# Patient Record
Sex: Male | Born: 1965 | Race: White | Hispanic: No | Marital: Married | Smoking: Light tobacco smoker
Health system: Southern US, Community
[De-identification: ages and names within clinical notes are randomized; demographics above are authoritative.]

## PROBLEM LIST (undated history)

## (undated) DIAGNOSIS — Z973 Presence of spectacles and contact lenses: Secondary | ICD-10-CM

## (undated) DIAGNOSIS — M47814 Spondylosis without myelopathy or radiculopathy, thoracic region: Secondary | ICD-10-CM

## (undated) DIAGNOSIS — M1712 Unilateral primary osteoarthritis, left knee: Secondary | ICD-10-CM

## (undated) DIAGNOSIS — I10 Essential (primary) hypertension: Secondary | ICD-10-CM

## (undated) DIAGNOSIS — M545 Low back pain, unspecified: Secondary | ICD-10-CM

## (undated) DIAGNOSIS — Z87442 Personal history of urinary calculi: Secondary | ICD-10-CM

## (undated) DIAGNOSIS — M47817 Spondylosis without myelopathy or radiculopathy, lumbosacral region: Secondary | ICD-10-CM

## (undated) DIAGNOSIS — M48062 Spinal stenosis, lumbar region with neurogenic claudication: Secondary | ICD-10-CM

## (undated) DIAGNOSIS — G4730 Sleep apnea, unspecified: Secondary | ICD-10-CM

## (undated) DIAGNOSIS — R7303 Prediabetes: Secondary | ICD-10-CM

## (undated) DIAGNOSIS — E785 Hyperlipidemia, unspecified: Secondary | ICD-10-CM

## (undated) DIAGNOSIS — N200 Calculus of kidney: Secondary | ICD-10-CM

## (undated) DIAGNOSIS — E119 Type 2 diabetes mellitus without complications: Secondary | ICD-10-CM

## (undated) HISTORY — DX: Unilateral primary osteoarthritis, left knee: M17.12

## (undated) HISTORY — DX: Essential (primary) hypertension: I10

## (undated) HISTORY — DX: Hyperlipidemia, unspecified: E78.5

## (undated) HISTORY — DX: Spondylosis without myelopathy or radiculopathy, lumbosacral region: M47.817

## (undated) HISTORY — PX: APPENDECTOMY: SHX54

## (undated) HISTORY — DX: Type 2 diabetes mellitus without complications: E11.9

## (undated) HISTORY — PX: LITHOTRIPSY: SUR834

## (undated) HISTORY — PX: SPINAL FIXATION SURGERY: SHX1055

## (undated) HISTORY — DX: Spondylosis without myelopathy or radiculopathy, thoracic region: M47.814

## (undated) HISTORY — PX: WISDOM TOOTH EXTRACTION: SHX21

## (undated) HISTORY — DX: Low back pain, unspecified: M54.50

---
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
## 2015-09-21 ENCOUNTER — Encounter: Payer: Self-pay | Admitting: Sports Medicine

## 2015-09-21 ENCOUNTER — Ambulatory Visit (INDEPENDENT_AMBULATORY_CARE_PROVIDER_SITE_OTHER): Payer: BLUE CROSS/BLUE SHIELD

## 2015-09-21 ENCOUNTER — Ambulatory Visit (INDEPENDENT_AMBULATORY_CARE_PROVIDER_SITE_OTHER): Payer: BLUE CROSS/BLUE SHIELD | Admitting: Sports Medicine

## 2015-09-21 DIAGNOSIS — M2142 Flat foot [pes planus] (acquired), left foot: Secondary | ICD-10-CM | POA: Diagnosis not present

## 2015-09-21 DIAGNOSIS — M722 Plantar fascial fibromatosis: Secondary | ICD-10-CM | POA: Diagnosis not present

## 2015-09-21 DIAGNOSIS — M79673 Pain in unspecified foot: Secondary | ICD-10-CM

## 2015-09-21 DIAGNOSIS — M2141 Flat foot [pes planus] (acquired), right foot: Secondary | ICD-10-CM

## 2015-09-21 DIAGNOSIS — M19079 Primary osteoarthritis, unspecified ankle and foot: Secondary | ICD-10-CM

## 2015-09-21 MED ORDER — METHYLPREDNISOLONE 4 MG PO TBPK: ORAL_TABLET | ORAL | Status: DC

## 2015-09-21 NOTE — Patient Instructions (Signed)

## 2015-09-21 NOTE — Progress Notes (Signed)
Patient ID: Mario Rogers, male   DOB: 03-14-1966, 50 y.o.   MRN: [id number] Subjective: Mario Rogers is a 50 y.o. male patient presents to office with complaint of heel pain on the left. Patient admits to post static dyskinesia for 3 months in duration. Patient has treated this problem with OTC inserts or which he uses more so at work in his boot to help his back but hasn't gotten any relief in feet. States that the pain comes and goes and also involves his left ankle. Patient denies any other pedal complaints.  Admits to hx of radiculopathy.  Work: Maintenance on cement floors   There are no active problems to display for this patient.   No current outpatient prescriptions on file prior to visit.   No current facility-administered medications on file prior to visit.    No Known Allergies  Objective: Physical Exam General: The patient is alert and oriented x3 in no acute distress.  Dermatology: Skin is warm, dry and supple bilateral lower extremities. Nails 1-10 are normal. There is no erythema, edema, no eccymosis, no open lesions present. Integument is otherwise unremarkable.  Vascular: Dorsalis Pedis pulse and Posterior Tibial pulse are 2/4 bilateral. Capillary fill time is immediate to all digits.  Neurological: Grossly intact to light touch with an achilles reflex of +2/5 and a  negative Tinel's sign bilateral.  Musculoskeletal: Tenderness to palpation at the medial calcaneal tubercale and through the insertion of the plantar fascia on the left foot. No reproducible pain at left ankle; subjective pain at this area too per patient, No pain with compression of calcaneus bilateral. No pain with tuning fork to calcaneus bilateral. No pain with calf compression bilateral. There is decreased Ankle joint range of motion bilateral. All other joints range of motion within normal limits bilateral. Pes planus foot type bilateral. Strength 5/5 in all groups bilateral.   Xray, Right & Left  foot:  Normal osseous mineralization. Joint spaces preserved. No fracture/dislocation/boney destruction. Diffuse ankle, midfoot, 1st MTPJ, and early IPJ arthritis, + midtarsal breach suggestive of planus, Calcaneal spur present with moderate thickening of plantar fascia. No other soft tissue abnormalities or radiopaque foreign bodies.   Assessment and Plan: Problem List Items Addressed This Visit    None    Visit Diagnoses    Foot pain, unspecified laterality    -  Primary    Relevant Medications    methylPREDNISolone (MEDROL DOSEPAK) 4 MG TBPK tablet    Other Relevant Orders    DG Foot 2 Views Left    DG Foot 2 Views Right    Plantar fasciitis of left foot        Relevant Medications    methylPREDNISolone (MEDROL DOSEPAK) 4 MG TBPK tablet    Pes planus of both feet        Relevant Medications    methylPREDNISolone (MEDROL DOSEPAK) 4 MG TBPK tablet    Osteoarthritis of ankle and foot, unspecified laterality        Relevant Medications    methylPREDNISolone (MEDROL DOSEPAK) 4 MG TBPK tablet      -Complete examination performed. Discussed with patient in detail the condition of plantar fasciitis, how this occurs and general treatment options. Explained both conservative and surgical treatments.  -Xrays reviewed  -Patient declined injection  -Rx Medrol dose pack once completed to start Mobic of which he already has -Recommended good supportive shoes and advised use of OTC insert. Explained to patient that if these orthoses work well, we will  continue with these. If these do not improve his condition and  pain, we will consider custom molded orthoses. -Explained in detail the use of the fascial brace which was dispensed at today's visit for the left foot. -Explained and dispensed to patient daily stretching exercises. -Recommend patient to ice affected area 1-2x daily. -Patient to return to office in 3 weeks for follow up or sooner if problems or questions arise.  Mario Rogers,  DPM

## 2015-10-12 ENCOUNTER — Ambulatory Visit: Payer: BLUE CROSS/BLUE SHIELD | Admitting: Sports Medicine

## 2015-10-18 ENCOUNTER — Ambulatory Visit: Payer: BLUE CROSS/BLUE SHIELD | Admitting: Sports Medicine

## 2019-03-02 ENCOUNTER — Encounter: Payer: Self-pay | Admitting: Neurology

## 2019-03-10 ENCOUNTER — Encounter: Payer: Self-pay | Admitting: Neurology

## 2019-03-15 ENCOUNTER — Other Ambulatory Visit: Payer: Self-pay

## 2019-03-15 ENCOUNTER — Ambulatory Visit (INDEPENDENT_AMBULATORY_CARE_PROVIDER_SITE_OTHER): Payer: 59 | Admitting: Neurology

## 2019-03-15 ENCOUNTER — Encounter: Payer: Self-pay | Admitting: Neurology

## 2019-03-15 VITALS — BP 140/90 | HR 84 | Ht 72.0 in | Wt 287.0 lb

## 2019-03-15 DIAGNOSIS — M21372 Foot drop, left foot: Secondary | ICD-10-CM | POA: Diagnosis not present

## 2019-03-15 DIAGNOSIS — R202 Paresthesia of skin: Secondary | ICD-10-CM | POA: Diagnosis not present

## 2019-03-15 NOTE — Patient Instructions (Signed)
We will request your MRI lumbar spine from [facility] and let you know the next step

## 2019-03-15 NOTE — Progress Notes (Signed)
[facility] Clinic Note - Initial Visit   Date: 03/15/19  Mario Rogers MRN: [id number] DOB: Feb 19, 1966   Dear Dr.  Tomasa Blase:  Thank you for your kind referral of Mario Rogers for consultation of left foot numbness and weakness. Although his history is well known to you, please allow [place] to reiterate it for the purpose of our medical record. The patient was accompanied to the clinic by self.    History of Present Illness: Mario Rogers is a 52 y.o. left-handed male with hypertension, hyperlipidemia, diabetes mellitus, and L4-5 lumbar surgery (1987)  presenting for evaluation of left foot weakness and numbness.   Starting in early September 2020, he developed tingling over the left foot and then developed numbness.  He started having weakness in the left foot, such that he is unable to raise his toes up.  He denies any preceding injury to his back or leg.  He has mild low back pain, no radicular pain.  No weakness in the right foot.  He has noticed intermittent tingling in the right foot. No falls.   He saw Dr. Durwin Nora at [facility] who ordered MRI lumbar spine.  This was performed on 9/14, but is not aware of the results and I do not have them to review.    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 Aliene Beams in 1987.  Out-side paper records, electronic medical record, and images have been reviewed where available and summarized as:  Labs 02/23/2019:  Vitamin B12 563, TSH 1.35, folate 5.9, HbA1c 6.7   Past Medical History:  Diagnosis Date   Acute low back pain    Diabetes mellitus without complication (HCC)    Hyperlipidemia    Hypertension    Kidney stone on left side    Lumbar and sacral osteoarthritis    Osteoarthritis thoracic spine    Primary osteoarthritis of left knee     Past Surgical History:  Procedure Laterality Date   APPENDECTOMY     LITHOTRIPSY     SPINAL FIXATION SURGERY        Medications:  Outpatient Encounter Medications as of 03/15/2019  Medication Sig Note   meloxicam (MOBIC) 15 MG tablet Take 15 mg by mouth daily.    methylPREDNISolone (MEDROL DOSEPAK) 4 MG TBPK tablet Take as instructed    metoprolol (LOPRESSOR) 50 MG tablet Take 50 mg by mouth 1 day or 1 dose. Takes at night 09/21/2015: Received from: External Pharmacy   telmisartan (MICARDIS) 40 MG tablet Take 40 mg by mouth daily.    [DISCONTINUED] telmisartan-hydrochlorothiazide (MICARDIS HCT) 40-12.5 MG tablet Take 1 tablet by mouth 2 (two) times daily. 09/21/2015: Received from: External Pharmacy   hydrochlorothiazide (HYDRODIURIL) 25 MG tablet Take 25 mg by mouth daily.    No facility-administered encounter medications on file as of 03/15/2019.     Allergies: No Known Allergies  Family History: Family History  Problem Relation Age of Onset   Leukemia Mother    Diabetes Father    Early death Brother        MVA    Social History: Social History   Tobacco Use   Smoking status: Never Smoker   Smokeless tobacco: Never Used  Substance Use Topics   Alcohol use: Yes    Alcohol/week: 0.0 standard drinks    Comment: Rarely   Drug use: Never   Social History   Social History Narrative   No children, step children one  One story home   Left handed   Maintenance mechanic    Review of Systems:  CONSTITUTIONAL: No fevers, chills, night sweats, or weight loss.   EYES: No visual changes or eye pain ENT: No hearing changes.  No history of nose bleeds.   RESPIRATORY: No cough, wheezing and shortness of breath.   CARDIOVASCULAR: Negative for chest pain, and palpitations.   GI: Negative for abdominal discomfort, blood in stools or black stools.  No recent change in bowel habits.   GU:  No history of incontinence.   MUSCLOSKELETAL: No history of joint pain or swelling.  No myalgias.   SKIN: + toe nail lesions, rash, and itching.   HEMATOLOGY/ONCOLOGY: Negative for prolonged  bleeding, bruising easily, and swollen nodes.  No history of cancer.   ENDOCRINE: Negative for cold or heat intolerance, polydipsia or goiter.   PSYCH:  No depression or anxiety symptoms.   NEURO: As Above.   Vital Signs:  BP 140/90    Pulse 84    Ht 6' (1.829 m)    Wt 287 lb (130.2 kg)    SpO2 98%    BMI 38.92 kg/m    General Medical Exam:   General:  Well appearing, comfortable.   Eyes/ENT: see cranial nerve examination.   Neck:   No carotid bruits. Respiratory:  Clear to auscultation, good air entry bilaterally.   Cardiac:  Regular rate and rhythm, no murmur.   Extremities:  No deformities.  Mild 1+ pitting edema. Reduced hair grown distally in the lower leg Skin:  Discoloration over the great toenail bilaterally.  Neurological Exam: MENTAL STATUS including orientation to time, place, person, recent and remote memory, attention span and concentration, language, and fund of knowledge is normal.  Speech is not dysarthric.  CRANIAL NERVES: II:  No visual field defects.   III-IV-VI: Pupils equal round and reactive to light.  Normal conjugate, extra-ocular eye movements in all directions of gaze.  No nystagmus.  No ptosis.   V:  Normal facial sensation.    VII:  Normal facial symmetry and movements.   VIII:  Normal hearing and vestibular function.   IX-X:  Normal palatal movement.   XI:  Normal shoulder shrug and head rotation.   XII:  Normal tongue strength and range of motion, no deviation or fasciculation.  MOTOR:  No atrophy, fasciculations or abnormal movements.  No pronator drift.   Upper Extremity:  Right  Left  Deltoid  5/5   5/5   Biceps  5/5   5/5   Triceps  5/5   5/5   Infraspinatus 5/5  5/5  Medial pectoralis 5/5  5/5  Wrist extensors  5/5   5/5   Wrist flexors  5/5   5/5   Finger extensors  5/5   5/5   Finger flexors  5/5   5/5   Dorsal interossei  5/5   5/5   Abductor pollicis  5/5   5/5   Tone (Ashworth scale)  0  0   Lower Extremity:  Right  Left  Hip  flexors  5/5   5/5   Hip extensors  5/5   5/5   Adductor 5/5  5/5  Abductor 5/5  5/5  Knee flexors  5/5   5/5   Knee extensors  5/5   5/5   Dorsiflexors  5/5   2/5   Plantarflexors  5/5   5/5   Eversion 5/5  4-/5  Inversion 5/5  4-/5  Toe extensors  5/5   2/5   Toe flexors  5/5   5/5   Tone (Ashworth scale)  0  0   MSRs:  Right        Left                  brachioradialis 2+  2+  biceps 2+  2+  triceps 2+  2+  patellar 2+  2+  ankle jerk 0  0  Hoffman no  no  plantar response down  down   SENSORY:  Vibration reduced at the great toe bilaterally and R ankle 50%, L ankle 20%, intact at the knees.  Pin prick and temperature is reduced over the dorsum of the left foot.    COORDINATION/GAIT: Normal finger-to- nose-finger.  Intact rapid alternating movements bilaterally.  Able to rise from a chair without using arms.  Gait shows mild dragging of the left foot.  He is able to walk on toes, unable to stand on heels on the left.   IMPRESSION: 1. Left foot drop and paresthesias most concerning for L5 radiculopathy given the involvement of inversion and preservation of plantar flexion.  Peroneal mononeuropathy seems less likely as this would only involve dorsiflexion and eversion.  I will MRI lumbar spine report from [facility] to review.  If there is no lesion in the lumbar spine to explain symptoms, NCS/EMG of the leg is the next step.    2. Peripheral neuropathy affecting bilateral feet, contributed by diabetes.  Consider further evaluation for this at a later date as the priority is addressing his left foot drop.  Patient educated on daily foot inspection, fall prevention, and safety precautions around the home.  Further recommendations pending MRI result.  Thank you for allowing me to participate in patient's care.  If I can answer any additional questions, I would be pleased to do so.    Sincerely,    Jillianne Gamino K. Allena KatzPatel, DO

## 2019-03-18 ENCOUNTER — Telehealth: Payer: Self-pay

## 2019-03-18 ENCOUNTER — Telehealth: Payer: Self-pay | Admitting: Neurology

## 2019-03-18 NOTE — Telephone Encounter (Signed)
Dr.Patel have you seen these results, I do not see them. Please advise

## 2019-03-18 NOTE — Telephone Encounter (Signed)
Close encounter 

## 2019-03-18 NOTE — Telephone Encounter (Signed)
Pt notified he will contact [facility] and have them fax results

## 2019-03-18 NOTE — Telephone Encounter (Signed)
Patient is calling in about the MRI at [facility]. He is wanting to ask some questions about that and see if Dr. Posey Pronto has seen those results. Thanks!

## 2019-03-18 NOTE — Telephone Encounter (Signed)
No, I have not rec'd them.  Please call them again and see if they can fax the MRI lumbar spine. Thanks.

## 2019-03-19 ENCOUNTER — Telehealth: Payer: Self-pay | Admitting: Neurology

## 2019-03-19 NOTE — Telephone Encounter (Signed)
ROI fax to [facility] for records.

## 2019-04-21 ENCOUNTER — Other Ambulatory Visit: Payer: Self-pay | Admitting: Neurosurgery

## 2019-04-21 ENCOUNTER — Telehealth: Payer: Self-pay | Admitting: Nurse Practitioner

## 2019-04-21 DIAGNOSIS — M48062 Spinal stenosis, lumbar region with neurogenic claudication: Secondary | ICD-10-CM

## 2019-04-21 NOTE — Telephone Encounter (Signed)
Phone call to patient to verify medication list and allergies for myelogram procedure. Pt aware he will not need to hold any medications for this procedure. Pre and post procedure instructions reviewed with pt. Pt verbalized understanding.  

## 2019-05-04 ENCOUNTER — Other Ambulatory Visit: Payer: 59

## 2019-05-07 ENCOUNTER — Other Ambulatory Visit: Payer: Self-pay

## 2019-05-07 ENCOUNTER — Ambulatory Visit
Admission: RE | Admit: 2019-05-07 | Discharge: 2019-05-07 | Disposition: A | Discharge status: Home / Self Care | Payer: 59 | Source: Ambulatory Visit | Attending: Neurosurgery | Admitting: Neurosurgery

## 2019-05-07 DIAGNOSIS — M48062 Spinal stenosis, lumbar region with neurogenic claudication: Secondary | ICD-10-CM

## 2019-05-07 MED ORDER — IOPAMIDOL (ISOVUE-M 200) INJECTION 41%
20.0000 mL | Freq: Once | INTRAMUSCULAR | Status: AC
  Administered 2019-05-07: 20 mL via INTRATHECAL

## 2019-05-07 MED ORDER — DIAZEPAM 5 MG PO TABS
10.0000 mg | ORAL_TABLET | Freq: Once | ORAL | Status: AC
  Administered 2019-05-07: 09:00:00 10 mg via ORAL

## 2019-05-07 NOTE — Discharge Instructions (Signed)

## 2019-05-17 ENCOUNTER — Other Ambulatory Visit: Payer: Self-pay | Admitting: Neurosurgery

## 2019-05-24 ENCOUNTER — Encounter (HOSPITAL_COMMUNITY): Payer: Self-pay

## 2019-05-24 ENCOUNTER — Other Ambulatory Visit (HOSPITAL_COMMUNITY)
Admission: RE | Admit: 2019-05-24 | Discharge: 2019-05-24 | Disposition: A | Discharge status: Home / Self Care | Payer: 59 | Source: Ambulatory Visit | Attending: Neurosurgery | Admitting: Neurosurgery

## 2019-05-24 ENCOUNTER — Encounter (HOSPITAL_COMMUNITY)
Admission: RE | Admit: 2019-05-24 | Discharge: 2019-05-24 | Disposition: A | Discharge status: Home / Self Care | Payer: 59 | Source: Ambulatory Visit | Attending: Neurosurgery | Admitting: Neurosurgery

## 2019-05-24 ENCOUNTER — Other Ambulatory Visit: Payer: Self-pay

## 2019-05-24 DIAGNOSIS — Z01812 Encounter for preprocedural laboratory examination: Secondary | ICD-10-CM | POA: Insufficient documentation

## 2019-05-24 DIAGNOSIS — Z01810 Encounter for preprocedural cardiovascular examination: Secondary | ICD-10-CM | POA: Insufficient documentation

## 2019-05-24 DIAGNOSIS — I10 Essential (primary) hypertension: Secondary | ICD-10-CM | POA: Insufficient documentation

## 2019-05-24 HISTORY — DX: Prediabetes: R73.03

## 2019-05-24 HISTORY — DX: Sleep apnea, unspecified: G47.30

## 2019-05-24 HISTORY — DX: Presence of spectacles and contact lenses: Z97.3

## 2019-05-24 HISTORY — DX: Personal history of urinary calculi: Z87.442

## 2019-05-24 HISTORY — DX: Spinal stenosis, lumbar region with neurogenic claudication: M48.062

## 2019-05-24 LAB — BASIC METABOLIC PANEL
Anion gap: 9 (ref 5–15)
BUN: 15 mg/dL (ref 6–20)
CO2: 29 mmol/L (ref 22–32)
Calcium: 9.6 mg/dL (ref 8.9–10.3)
Chloride: 103 mmol/L (ref 98–111)
Creatinine, Ser: 1.1 mg/dL (ref 0.61–1.24)
GFR calc Af Amer: >60 mL/min (ref >60)
GFR calc non Af Amer: >60 mL/min (ref >60)
Glucose, Bld: 101 mg/dL — ABNORMAL HIGH (ref 70–99)
Potassium: 4.2 mmol/L (ref 3.5–5.1)
Sodium: 141 mmol/L (ref 135–145)

## 2019-05-24 LAB — CBC
HCT: 48.8 % (ref 39.0–52.0)
Hemoglobin: 15.8 g/dL (ref 13.0–17.0)
MCH: 28.6 pg (ref 26.0–34.0)
MCHC: 32.4 g/dL (ref 30.0–36.0)
MCV: 88.2 fL (ref 80.0–100.0)
Platelets: 188 10*3/uL (ref 150–400)
RBC: 5.53 MIL/uL (ref 4.22–5.81)
RDW: 12.6 % (ref 11.5–15.5)
WBC: 9.3 10*3/uL (ref 4.0–10.5)
nRBC: 0 % (ref 0.0–0.2)

## 2019-05-24 LAB — SARS CORONAVIRUS 2 (TAT 6-24 HRS): SARS Coronavirus 2: NEGATIVE

## 2019-05-24 LAB — SURGICAL PCR SCREEN
MRSA, PCR: NEGATIVE
Staphylococcus aureus: NEGATIVE

## 2019-05-24 LAB — GLUCOSE, CAPILLARY: Glucose-Capillary: 86 mg/dL (ref 70–99)

## 2019-05-24 NOTE — Progress Notes (Signed)
Pt stated that his blood pressure is elevated today during PAT visit because he is having pain and he only took " half of Furosemide -20 mg instead of 40 " because he had to travel and didn't want to have to keep making stops to void. Pt denies SOB, chest pain, and being under the care of a cardiologist. Pt stated that his PCP is Dr. Nelda Bucks, in [place], [place]. Pt stated that a stress test was performed > 20 years ago but denies having an echo and cardiac cath. Pt denies having an EKG and chest x ray in the last year. Pt denies recent labs. Pt denies having Diabetes stating " I was told that I am borderline diabetic, I don't take medicine and I no longer check my sugar. " Pt verbalized understanding of all pre-op instructions.

## 2019-05-24 NOTE — Pre-Procedure Instructions (Addendum)
   Mario Rogers  05/24/2019    [id number] - [place] [address] Phone: [phone] Fax: [phone]   Your procedure is scheduled on Wednesday, May 26, 2019  Report to [facility] Admitting at 6:30 A.M.  Call this number if you have problems the morning of surgery:  [phone]   Remember:  Do not eat or drink after midnight the night before surgery.    Take these medicines the morning of surgery with A SIP OF WATER :  If needed: acetaminophen (TYLENOL) for pain  Stop taking Aspirin (unless otherwise advised by surgeon), vitamins, fish oil and herbal medications. Do not take any NSAIDs ie: Meloxicam (Mobic),  Ibuprofen, Advil, Naproxen (Aleve), Motrin, BC and Goody Powder; stop now.  Do not wear jewelry, make-up or nail polish.  Do not wear lotions, powders, or perfumes, or deodorant.  Do not shave 48 hours prior to surgery.  Men may shave face and neck.  Do not bring valuables to the hospital.  [facility] is not responsible for any belongings or valuables.  Contacts, dentures or bridgework may not be worn into surgery.  Leave your suitcase in the car.  After surgery it may be brought to your room.  For patients admitted to the hospital, discharge time will be determined by your treatment team. Patients discharged the day of surgery will not be allowed to drive home.   Special instructions: See " [facility] Preparing For Surgery " sheet.  Please read over the following fact sheets that you were given. Pain Booklet, Coughing and Deep Breathing and Surgical Site Infection Prevention

## 2019-05-25 ENCOUNTER — Other Ambulatory Visit: Payer: Self-pay | Admitting: Neurosurgery

## 2019-05-25 MED ORDER — DEXTROSE 5 % IV SOLN
3.0000 g | INTRAVENOUS | Status: AC
  Administered 2019-05-26: 3 g via INTRAVENOUS
  Filled 2019-05-25: qty 3000
  Filled 2019-05-25: qty 3

## 2019-05-25 NOTE — Anesthesia Preprocedure Evaluation (Addendum)
Anesthesia Evaluation  Patient identified by MRN, date of birth, ID band Patient awake    Reviewed: Allergy & Precautions, NPO status , Patient's Chart, lab work & pertinent test results  History of Anesthesia Complications Negative for: history of anesthetic complications  Airway Mallampati: III  TM Distance: >3 FB Neck ROM: Full    Dental no notable dental hx. (+) Dental Advisory Given   Pulmonary neg pulmonary ROS, Patient did not abstain from smoking.,    Pulmonary exam normal        Cardiovascular hypertension, Pt. on medications Normal cardiovascular exam     Neuro/Psych negative neurological ROS     GI/Hepatic negative GI ROS, Neg liver ROS,   Endo/Other  diabetes  Renal/GU negative Renal ROS     Musculoskeletal negative musculoskeletal ROS (+)   Abdominal   Peds  Hematology negative hematology ROS (+)   Anesthesia Other Findings Day of surgery medications reviewed with the patient.  Reproductive/Obstetrics                            Anesthesia Physical Anesthesia Plan  ASA: III  Anesthesia Plan: General   Post-op Pain Management:    Induction: Intravenous  PONV Risk Score and Plan: 2 and Ondansetron and Dexamethasone  Airway Management Planned: Oral ETT  Additional Equipment:   Intra-op Plan:   Post-operative Plan: Extubation in OR  Informed Consent: I have reviewed the patients History and Physical, chart, labs and discussed the procedure including the risks, benefits and alternatives for the proposed anesthesia with the patient or authorized representative who has indicated his/her understanding and acceptance.     Dental advisory given  Plan Discussed with: Anesthesiologist and CRNA  Anesthesia Plan Comments:        Anesthesia Quick Evaluation

## 2019-05-26 ENCOUNTER — Ambulatory Visit (HOSPITAL_COMMUNITY): Payer: 59 | Admitting: Anesthesiology

## 2019-05-26 ENCOUNTER — Encounter (HOSPITAL_COMMUNITY)
Admission: RE | Disposition: A | Discharge status: Home / Self Care | Payer: Self-pay | Source: Home / Self Care | Attending: Neurosurgery

## 2019-05-26 ENCOUNTER — Ambulatory Visit (HOSPITAL_COMMUNITY): Payer: 59

## 2019-05-26 ENCOUNTER — Ambulatory Visit (HOSPITAL_COMMUNITY)
Admission: RE | Admit: 2019-05-26 | Discharge: 2019-05-26 | Disposition: A | Discharge status: Home / Self Care | Payer: 59 | Attending: Neurosurgery | Admitting: Neurosurgery

## 2019-05-26 ENCOUNTER — Encounter (HOSPITAL_COMMUNITY): Payer: Self-pay

## 2019-05-26 ENCOUNTER — Other Ambulatory Visit: Payer: Self-pay

## 2019-05-26 DIAGNOSIS — E119 Type 2 diabetes mellitus without complications: Secondary | ICD-10-CM | POA: Diagnosis not present

## 2019-05-26 DIAGNOSIS — G4730 Sleep apnea, unspecified: Secondary | ICD-10-CM | POA: Diagnosis not present

## 2019-05-26 DIAGNOSIS — M1712 Unilateral primary osteoarthritis, left knee: Secondary | ICD-10-CM | POA: Insufficient documentation

## 2019-05-26 DIAGNOSIS — Z791 Long term (current) use of non-steroidal anti-inflammatories (NSAID): Secondary | ICD-10-CM | POA: Diagnosis not present

## 2019-05-26 DIAGNOSIS — I10 Essential (primary) hypertension: Secondary | ICD-10-CM | POA: Diagnosis not present

## 2019-05-26 DIAGNOSIS — Z79899 Other long term (current) drug therapy: Secondary | ICD-10-CM | POA: Diagnosis not present

## 2019-05-26 DIAGNOSIS — M5416 Radiculopathy, lumbar region: Secondary | ICD-10-CM | POA: Insufficient documentation

## 2019-05-26 DIAGNOSIS — E785 Hyperlipidemia, unspecified: Secondary | ICD-10-CM | POA: Insufficient documentation

## 2019-05-26 DIAGNOSIS — F17210 Nicotine dependence, cigarettes, uncomplicated: Secondary | ICD-10-CM | POA: Insufficient documentation

## 2019-05-26 DIAGNOSIS — Z419 Encounter for procedure for purposes other than remedying health state, unspecified: Secondary | ICD-10-CM

## 2019-05-26 DIAGNOSIS — M48062 Spinal stenosis, lumbar region with neurogenic claudication: Secondary | ICD-10-CM | POA: Diagnosis not present

## 2019-05-26 HISTORY — PX: LUMBAR LAMINECTOMY/DECOMPRESSION MICRODISCECTOMY: SHX5026

## 2019-05-26 LAB — GLUCOSE, CAPILLARY
Glucose-Capillary: 107 mg/dL — ABNORMAL HIGH (ref 70–99)
Glucose-Capillary: 112 mg/dL — ABNORMAL HIGH (ref 70–99)

## 2019-05-26 SURGERY — LUMBAR LAMINECTOMY/DECOMPRESSION MICRODISCECTOMY 2 LEVELS
Anesthesia: General | Site: Spine Lumbar | Laterality: Bilateral

## 2019-05-26 MED ORDER — LACTATED RINGERS IV SOLN
INTRAVENOUS | Status: DC | PRN
  Administered 2019-05-26: 08:00:00 via INTRAVENOUS

## 2019-05-26 MED ORDER — CYCLOBENZAPRINE HCL 10 MG PO TABS: 10.0000 mg | ORAL_TABLET | Freq: Three times a day (TID) | ORAL | Status: DC | PRN

## 2019-05-26 MED ORDER — BUPIVACAINE-EPINEPHRINE (PF) 0.5% -1:200000 IJ SOLN
INTRAMUSCULAR | Status: DC | PRN
  Administered 2019-05-26: 10 mL

## 2019-05-26 MED ORDER — BUPIVACAINE LIPOSOME 1.3 % IJ SUSP
INTRAMUSCULAR | Status: DC | PRN
  Administered 2019-05-26: 20 mL

## 2019-05-26 MED ORDER — PROMETHAZINE HCL 25 MG/ML IJ SOLN: 6.2500 mg | INTRAMUSCULAR | Status: DC | PRN

## 2019-05-26 MED ORDER — SODIUM CHLORIDE 0.9 % IV SOLN
INTRAVENOUS | Status: DC | PRN
  Administered 2019-05-26: 10:00:00

## 2019-05-26 MED ORDER — ACETAMINOPHEN 500 MG PO TABS
1000.0000 mg | ORAL_TABLET | Freq: Four times a day (QID) | ORAL | Status: DC
  Administered 2019-05-26: 1000 mg via ORAL
  Filled 2019-05-26: qty 2

## 2019-05-26 MED ORDER — CELECOXIB 200 MG PO CAPS
400.0000 mg | ORAL_CAPSULE | Freq: Once | ORAL | Status: AC
  Administered 2019-05-26: 07:00:00 400 mg via ORAL
  Filled 2019-05-26: qty 2

## 2019-05-26 MED ORDER — ACETAMINOPHEN 650 MG RE SUPP: 650.0000 mg | RECTAL | Status: DC | PRN

## 2019-05-26 MED ORDER — IRBESARTAN 150 MG PO TABS
150.0000 mg | ORAL_TABLET | Freq: Every day | ORAL | Status: DC
  Filled 2019-05-26: qty 1

## 2019-05-26 MED ORDER — FENTANYL CITRATE (PF) 250 MCG/5ML IJ SOLN
INTRAMUSCULAR | Status: DC | PRN
  Administered 2019-05-26 (×2): 50 ug via INTRAVENOUS
  Administered 2019-05-26: 150 ug via INTRAVENOUS

## 2019-05-26 MED ORDER — ONDANSETRON HCL 4 MG/2ML IJ SOLN: 4.0000 mg | Freq: Four times a day (QID) | INTRAMUSCULAR | Status: DC | PRN

## 2019-05-26 MED ORDER — PROPOFOL 10 MG/ML IV BOLUS
INTRAVENOUS | Status: AC
  Filled 2019-05-26: qty 40

## 2019-05-26 MED ORDER — SODIUM CHLORIDE 0.9% FLUSH: 3.0000 mL | Freq: Two times a day (BID) | INTRAVENOUS | Status: DC

## 2019-05-26 MED ORDER — BUPIVACAINE LIPOSOME 1.3 % IJ SUSP
20.0000 mL | Freq: Once | INTRAMUSCULAR | Status: DC
  Filled 2019-05-26: qty 20

## 2019-05-26 MED ORDER — DEXAMETHASONE SODIUM PHOSPHATE 10 MG/ML IJ SOLN
INTRAMUSCULAR | Status: DC | PRN
  Administered 2019-05-26: 4 mg via INTRAVENOUS

## 2019-05-26 MED ORDER — ONDANSETRON HCL 4 MG PO TABS: 4.0000 mg | ORAL_TABLET | Freq: Four times a day (QID) | ORAL | Status: DC | PRN

## 2019-05-26 MED ORDER — PHENOL 1.4 % MT LIQD: 1.0000 | OROMUCOSAL | Status: DC | PRN

## 2019-05-26 MED ORDER — BACITRACIN ZINC 500 UNIT/GM EX OINT
TOPICAL_OINTMENT | CUTANEOUS | Status: DC | PRN
  Administered 2019-05-26: 1 via TOPICAL

## 2019-05-26 MED ORDER — ONDANSETRON HCL 4 MG/2ML IJ SOLN
INTRAMUSCULAR | Status: DC | PRN
  Administered 2019-05-26: 4 mg via INTRAVENOUS

## 2019-05-26 MED ORDER — KETAMINE HCL 10 MG/ML IJ SOLN
INTRAMUSCULAR | Status: DC | PRN
  Administered 2019-05-26: 50 mg via INTRAVENOUS

## 2019-05-26 MED ORDER — BISACODYL 10 MG RE SUPP: 10.0000 mg | Freq: Every day | RECTAL | Status: DC | PRN

## 2019-05-26 MED ORDER — BUPIVACAINE-EPINEPHRINE 0.5% -1:200000 IJ SOLN
INTRAMUSCULAR | Status: AC
  Filled 2019-05-26: qty 1

## 2019-05-26 MED ORDER — OXYCODONE HCL 10 MG PO TABS: 10.0000 mg | ORAL_TABLET | ORAL | 0 refills | Status: AC | PRN

## 2019-05-26 MED ORDER — EPHEDRINE SULFATE-NACL 50-0.9 MG/10ML-% IV SOSY
PREFILLED_SYRINGE | INTRAVENOUS | Status: DC | PRN
  Administered 2019-05-26 (×2): 10 mg via INTRAVENOUS
  Administered 2019-05-26: 15 mg via INTRAVENOUS

## 2019-05-26 MED ORDER — OXYCODONE HCL 5 MG PO TABS: 5.0000 mg | ORAL_TABLET | ORAL | Status: DC | PRN

## 2019-05-26 MED ORDER — FENTANYL CITRATE (PF) 100 MCG/2ML IJ SOLN: 25.0000 ug | INTRAMUSCULAR | Status: DC | PRN

## 2019-05-26 MED ORDER — SODIUM CHLORIDE 0.9% FLUSH: 3.0000 mL | INTRAVENOUS | Status: DC | PRN

## 2019-05-26 MED ORDER — LIDOCAINE 2% (20 MG/ML) 5 ML SYRINGE
INTRAMUSCULAR | Status: DC | PRN
  Administered 2019-05-26: 100 mg via INTRAVENOUS

## 2019-05-26 MED ORDER — MIDAZOLAM HCL 2 MG/2ML IJ SOLN
INTRAMUSCULAR | Status: DC | PRN
  Administered 2019-05-26: 2 mg via INTRAVENOUS

## 2019-05-26 MED ORDER — SODIUM CHLORIDE 0.9 % IV SOLN: 250.0000 mL | INTRAVENOUS | Status: DC

## 2019-05-26 MED ORDER — CHLORHEXIDINE GLUCONATE CLOTH 2 % EX PADS: 6.0000 | MEDICATED_PAD | Freq: Once | CUTANEOUS | Status: DC

## 2019-05-26 MED ORDER — PHENYLEPHRINE 40 MCG/ML (10ML) SYRINGE FOR IV PUSH (FOR BLOOD PRESSURE SUPPORT)
PREFILLED_SYRINGE | INTRAVENOUS | Status: DC | PRN
  Administered 2019-05-26: 80 ug via INTRAVENOUS

## 2019-05-26 MED ORDER — 0.9 % SODIUM CHLORIDE (POUR BTL) OPTIME
TOPICAL | Status: DC | PRN
  Administered 2019-05-26: 1000 mL

## 2019-05-26 MED ORDER — MIDAZOLAM HCL 2 MG/2ML IJ SOLN
INTRAMUSCULAR | Status: AC
  Filled 2019-05-26: qty 2

## 2019-05-26 MED ORDER — THROMBIN 5000 UNITS EX SOLR
CUTANEOUS | Status: DC | PRN
  Administered 2019-05-26 (×2): 5000 [IU] via TOPICAL

## 2019-05-26 MED ORDER — DOCUSATE SODIUM 100 MG PO CAPS: 100.0000 mg | ORAL_CAPSULE | Freq: Two times a day (BID) | ORAL | Status: DC

## 2019-05-26 MED ORDER — ROCURONIUM BROMIDE 10 MG/ML (PF) SYRINGE
PREFILLED_SYRINGE | INTRAVENOUS | Status: DC | PRN
  Administered 2019-05-26: 100 mg via INTRAVENOUS
  Administered 2019-05-26: 20 mg via INTRAVENOUS

## 2019-05-26 MED ORDER — CYCLOBENZAPRINE HCL 10 MG PO TABS: 10.0000 mg | ORAL_TABLET | Freq: Three times a day (TID) | ORAL | 0 refills | Status: AC | PRN

## 2019-05-26 MED ORDER — MENTHOL 3 MG MT LOZG: 1.0000 | LOZENGE | OROMUCOSAL | Status: DC | PRN

## 2019-05-26 MED ORDER — BACITRACIN ZINC 500 UNIT/GM EX OINT
TOPICAL_OINTMENT | CUTANEOUS | Status: AC
  Filled 2019-05-26: qty 28.35

## 2019-05-26 MED ORDER — THROMBIN 5000 UNITS EX SOLR
OROMUCOSAL | Status: DC | PRN
  Administered 2019-05-26: 10:00:00 via TOPICAL

## 2019-05-26 MED ORDER — SUGAMMADEX SODIUM 200 MG/2ML IV SOLN
INTRAVENOUS | Status: DC | PRN
  Administered 2019-05-26: 100 mg via INTRAVENOUS
  Administered 2019-05-26: 300 mg via INTRAVENOUS

## 2019-05-26 MED ORDER — KETAMINE HCL 50 MG/5ML IJ SOSY
PREFILLED_SYRINGE | INTRAMUSCULAR | Status: AC
  Filled 2019-05-26: qty 10

## 2019-05-26 MED ORDER — FENTANYL CITRATE (PF) 250 MCG/5ML IJ SOLN
INTRAMUSCULAR | Status: AC
  Filled 2019-05-26: qty 5

## 2019-05-26 MED ORDER — OXYCODONE HCL 5 MG PO TABS
10.0000 mg | ORAL_TABLET | ORAL | Status: DC | PRN
  Administered 2019-05-26 (×2): 10 mg via ORAL
  Filled 2019-05-26 (×2): qty 2

## 2019-05-26 MED ORDER — ACETAMINOPHEN 500 MG PO TABS
1000.0000 mg | ORAL_TABLET | Freq: Once | ORAL | Status: AC
  Administered 2019-05-26: 07:00:00 1000 mg via ORAL
  Filled 2019-05-26: qty 2

## 2019-05-26 MED ORDER — THROMBIN 5000 UNITS EX SOLR
CUTANEOUS | Status: AC
  Filled 2019-05-26: qty 15000

## 2019-05-26 MED ORDER — PROPOFOL 10 MG/ML IV BOLUS
INTRAVENOUS | Status: DC | PRN
  Administered 2019-05-26: 150 mg via INTRAVENOUS
  Administered 2019-05-26: 50 mg via INTRAVENOUS

## 2019-05-26 MED ORDER — MORPHINE SULFATE (PF) 4 MG/ML IV SOLN: 4.0000 mg | INTRAVENOUS | Status: DC | PRN

## 2019-05-26 MED ORDER — PHENYLEPHRINE HCL-NACL 10-0.9 MG/250ML-% IV SOLN
INTRAVENOUS | Status: DC | PRN
  Administered 2019-05-26: 20 ug/min via INTRAVENOUS

## 2019-05-26 MED ORDER — DEXTROSE 5 % IV SOLN
3.0000 g | Freq: Three times a day (TID) | INTRAVENOUS | Status: DC
  Filled 2019-05-26 (×2): qty 3000

## 2019-05-26 MED ORDER — HEMOSTATIC AGENTS (NO CHARGE) OPTIME
TOPICAL | Status: DC | PRN
  Administered 2019-05-26: 1 via TOPICAL

## 2019-05-26 MED ORDER — FUROSEMIDE 40 MG PO TABS
40.0000 mg | ORAL_TABLET | Freq: Every day | ORAL | Status: DC
  Filled 2019-05-26: qty 1

## 2019-05-26 MED ORDER — DOCUSATE SODIUM 100 MG PO CAPS: 100.0000 mg | ORAL_CAPSULE | Freq: Two times a day (BID) | ORAL | 0 refills | Status: AC

## 2019-05-26 MED ORDER — ACETAMINOPHEN 325 MG PO TABS: 650.0000 mg | ORAL_TABLET | ORAL | Status: DC | PRN

## 2019-05-26 SURGICAL SUPPLY — 52 items
BAG DECANTER FOR FLEXI CONT (MISCELLANEOUS) ×3 IMPLANT
BENZOIN TINCTURE PRP APPL 2/3 (GAUZE/BANDAGES/DRESSINGS) ×3 IMPLANT
BLADE CLIPPER SURG (BLADE) ×2 IMPLANT
BUR MATCHSTICK NEURO 3.0 LAGG (BURR) ×3 IMPLANT
BUR PRECISION FLUTE 6.0 (BURR) ×3 IMPLANT
CANISTER SUCT 3000ML PPV (MISCELLANEOUS) ×3 IMPLANT
CARTRIDGE OIL MAESTRO DRILL (MISCELLANEOUS) ×1 IMPLANT
CLOSURE WOUND 1/2 X4 (GAUZE/BANDAGES/DRESSINGS) ×1
DIFFUSER DRILL AIR PNEUMATIC (MISCELLANEOUS) ×3 IMPLANT
DRAPE LAPAROTOMY 100X72X124 (DRAPES) ×3 IMPLANT
DRAPE MICROSCOPE LEICA (MISCELLANEOUS) ×3 IMPLANT
DRAPE POUCH INSTRU U-SHP 10X18 (DRAPES) ×3 IMPLANT
DRAPE SURG 17X23 STRL (DRAPES) ×12 IMPLANT
DRSG OPSITE POSTOP 4X6 (GAUZE/BANDAGES/DRESSINGS) ×2 IMPLANT
ELECT BLADE 4.0 EZ CLEAN MEGAD (MISCELLANEOUS) ×3
ELECT REM PT RETURN 9FT ADLT (ELECTROSURGICAL) ×3
ELECTRODE BLDE 4.0 EZ CLN MEGD (MISCELLANEOUS) ×1 IMPLANT
ELECTRODE REM PT RTRN 9FT ADLT (ELECTROSURGICAL) ×1 IMPLANT
GAUZE 4X4 16PLY RFD (DISPOSABLE) IMPLANT
GAUZE SPONGE 4X4 12PLY STRL (GAUZE/BANDAGES/DRESSINGS) ×3 IMPLANT
GLOVE BIO SURGEON STRL SZ8 (GLOVE) ×3 IMPLANT
GLOVE BIO SURGEON STRL SZ8.5 (GLOVE) ×3 IMPLANT
GLOVE BIOGEL PI IND STRL 7.0 (GLOVE) IMPLANT
GLOVE BIOGEL PI IND STRL 7.5 (GLOVE) IMPLANT
GLOVE BIOGEL PI INDICATOR 7.0 (GLOVE) ×6
GLOVE BIOGEL PI INDICATOR 7.5 (GLOVE) ×6
GOWN STRL REUS W/ TWL LRG LVL3 (GOWN DISPOSABLE) IMPLANT
GOWN STRL REUS W/ TWL XL LVL3 (GOWN DISPOSABLE) ×1 IMPLANT
GOWN STRL REUS W/TWL 2XL LVL3 (GOWN DISPOSABLE) IMPLANT
GOWN STRL REUS W/TWL LRG LVL3 (GOWN DISPOSABLE) ×4
GOWN STRL REUS W/TWL XL LVL3 (GOWN DISPOSABLE) ×2
KIT BASIN OR (CUSTOM PROCEDURE TRAY) ×3 IMPLANT
KIT TURNOVER KIT B (KITS) ×3 IMPLANT
NDL HYPO 21X1 ECLIPSE (NEEDLE) IMPLANT
NDL HYPO 21X1.5 SAFETY (NEEDLE) IMPLANT
NEEDLE HYPO 21X1 ECLIPSE (NEEDLE) ×3 IMPLANT
NEEDLE HYPO 21X1.5 SAFETY (NEEDLE) IMPLANT
NEEDLE HYPO 22GX1.5 SAFETY (NEEDLE) ×3 IMPLANT
NS IRRIG 1000ML POUR BTL (IV SOLUTION) ×3 IMPLANT
OIL CARTRIDGE MAESTRO DRILL (MISCELLANEOUS) ×3
PACK LAMINECTOMY NEURO (CUSTOM PROCEDURE TRAY) ×3 IMPLANT
PAD ARMBOARD 7.5X6 YLW CONV (MISCELLANEOUS) ×9 IMPLANT
PATTIES SURGICAL .5 X1 (DISPOSABLE) IMPLANT
RUBBERBAND STERILE (MISCELLANEOUS) ×6 IMPLANT
SPONGE SURGIFOAM ABS GEL SZ50 (HEMOSTASIS) ×5 IMPLANT
STRIP CLOSURE SKIN 1/2X4 (GAUZE/BANDAGES/DRESSINGS) ×2 IMPLANT
SUT VIC AB 1 CT1 18XBRD ANBCTR (SUTURE) ×2 IMPLANT
SUT VIC AB 1 CT1 8-18 (SUTURE) ×4
SUT VIC AB 2-0 CP2 18 (SUTURE) ×6 IMPLANT
TOWEL GREEN STERILE (TOWEL DISPOSABLE) ×3 IMPLANT
TOWEL GREEN STERILE FF (TOWEL DISPOSABLE) ×3 IMPLANT
WATER STERILE IRR 1000ML POUR (IV SOLUTION) ×3 IMPLANT

## 2019-05-26 NOTE — Discharge Summary (Signed)
Physician Discharge Summary  Patient ID: Mario Rogers MRN: [id number] DOB/AGE: 1966/02/27 53 y.o.  Admit date: 05/26/2019 Discharge date: 05/26/2019  Admission Diagnoses: Lumbar spinal stenosis, lumbar radiculopathy, lumbago, neurogenic claudication  Discharge Diagnoses: The same Active Problems:   Spinal stenosis of lumbar region with neurogenic claudication   Discharged Condition: good  Hospital Course: I performed bilateral L3-4 and L4-5 laminotomy/foraminotomies on the patient on 05/26/2019.  The surgery went well.  The patient's postoperative course was unremarkable.  He requested discharge to home on the afternoon of 05/26/2019.  The patient, and his wife, were given oral and written discharge instructions.  All their questions were answered.  Consults: Physical therapy, Occupational Therapy, care management Significant Diagnostic Studies: None Treatments: Bilateral L3-4 and L4-5 laminotomy/foraminotomies using microdissection Discharge Exam: Blood pressure 134/83, pulse 85, temperature (!) 97.5 F (36.4 C), temperature source Oral, resp. rate 18, height 6\' 1"  (1.854 m), weight 127.2 kg, SpO2 96 %. The patient is alert and pleasant.  He looks well.  His lower extremity strength is grossly normal.  Disposition: Home  Discharge Instructions     Remove dressing in 72 hours   Complete by: As directed    Call MD for:  difficulty breathing, headache or visual disturbances   Complete by: As directed    Call MD for:  extreme fatigue   Complete by: As directed    Call MD for:  hives   Complete by: As directed    Call MD for:  persistant dizziness or light-headedness   Complete by: As directed    Call MD for:  persistant nausea and vomiting   Complete by: As directed    Call MD for:  redness, tenderness, or signs of infection (pain, swelling, redness, odor or green/yellow discharge around incision site)   Complete by: As directed    Call MD for:  severe uncontrolled pain    Complete by: As directed    Call MD for:  temperature >100.4   Complete by: As directed    Diet - low sodium heart healthy   Complete by: As directed    Discharge instructions   Complete by: As directed    Call [phone] for a followup appointment. Take a stool softener while you are using pain medications.   Driving Restrictions   Complete by: As directed    Do not drive for 2 weeks.   Increase activity slowly   Complete by: As directed    Lifting restrictions   Complete by: As directed    Do not lift more than 5 pounds. No excessive bending or twisting.   May shower / Bathe   Complete by: As directed    Remove the dressing for 3 days after surgery.  You may shower, but leave the incision alone.     Allergies as of 05/26/2019   No Known Allergies     Medication List    STOP taking these medications   acetaminophen 500 MG tablet Commonly known as: TYLENOL     TAKE these medications   cyclobenzaprine 10 MG tablet Commonly known as: FLEXERIL Take 1 tablet (10 mg total) by mouth 3 (three) times daily as needed for muscle spasms.   docusate sodium 100 MG capsule Commonly known as: COLACE Take 1 capsule (100 mg total) by mouth 2 (two) times daily.   furosemide 40 MG tablet Commonly known as: LASIX Take 40 mg by mouth daily.   Mobic 15 MG tablet Generic drug: meloxicam Take 15 mg by mouth  daily.   Oxycodone HCl 10 MG Tabs Take 1 tablet (10 mg total) by mouth every 4 (four) hours as needed for severe pain ((score 7 to 10)).   telmisartan 40 MG tablet Commonly known as: MICARDIS Take 40 mg by mouth daily.        Signed: Cristi Loron 05/26/2019, 4:09 PM

## 2019-05-26 NOTE — H&P (Signed)
Subjective: The patient is a 53 year old white male who has complained of back and bilateral leg pain consistent with neurogenic claudication.  He has failed medical management.  He was worked up with lumbar x-rays, a lumbar MRI, lumbar myelo CT, NCV EMGs, etc. this demonstrated spinal stenosis at L3-4 and L4-5.  I discussed the various treatment options with him.  He has decided to proceed with surgery.  Past Medical History:  Diagnosis Date  . Acute low back pain   . Borderline diabetes   . Diabetes mellitus without complication (HCC)     pt denies on 05/24/19 states " Borderline"  . History of kidney stones   . Hyperlipidemia   . Hypertension   . Lumbar and sacral osteoarthritis   . Lumbar stenosis with neurogenic claudication   . Osteoarthritis thoracic spine   . Primary osteoarthritis of left knee   . Sleep apnea    > 25 years ago; does not wear CPAP  . Wears glasses     Past Surgical History:  Procedure Laterality Date  . APPENDECTOMY    . LITHOTRIPSY    . SPINAL FIXATION SURGERY    . WISDOM TOOTH EXTRACTION      No Known Allergies  Social History   Tobacco Use  . Smoking status: Light Tobacco Smoker    Types: Cigars  . Smokeless tobacco: Never Used  . Tobacco comment: 1-2 yearly  Substance Use Topics  . Alcohol use: Yes    Alcohol/week: 0.0 standard drinks    Comment: Rarely    Family History  Problem Relation Age of Onset  . Leukemia Mother   . Diabetes Father   . Early death Brother        MVA   Prior to Admission medications   Medication Sig Start Date End Date Taking? Authorizing Provider  acetaminophen (TYLENOL) 500 MG tablet Take 1,000 mg by mouth every 8 (eight) hours as needed (pain).   Yes [provider]  furosemide (LASIX) 40 MG tablet Take 40 mg by mouth daily.    Yes [provider]  telmisartan (MICARDIS) 40 MG tablet Take 40 mg by mouth daily.   Yes [provider]  meloxicam (MOBIC) 15 MG tablet Take 15 mg by mouth  daily.     [provider]     Review of Systems  Positive ROS: As above  All other systems have been reviewed and were otherwise negative with the exception of those mentioned in the HPI and as above.  Objective: Vital signs in last 24 hours: Temp:  [98.5 F (36.9 C)] 98.5 F (36.9 C) (12/02 0703) Pulse Rate:  [63] 63 (12/02 0703) Resp:  [19] 19 (12/02 0703) BP: (147-167)/(95-104) 157/97 (12/02 0727) SpO2:  [99 %] 99 % (12/02 0703) Weight:  [127.2 kg] 127.2 kg (12/02 0703) Estimated body mass index is 36.99 kg/m as calculated from the following:   Height as of this encounter: 6\' 1"  (1.854 m).   Weight as of this encounter: 127.2 kg.   General Appearance: Alert Head: Normocephalic, without obvious abnormality, atraumatic Eyes: PERRL, conjunctiva/corneas clear, EOM's intact,    Ears: Normal  Throat: Normal  Neck: Supple, Back: unremarkable Lungs: Clear to auscultation bilaterally, respirations unlabored Heart: Regular rate and rhythm, no murmur, rub or gallop Abdomen: Soft, non-tender, obese Extremities: Extremities normal, atraumatic, no cyanosis or edema Skin: unremarkable  NEUROLOGIC:   Mental status: alert and oriented,Motor Exam - grossly normal Sensory Exam - grossly normal Reflexes:  Coordination - grossly normal  Gait - grossly normal Balance - grossly normal Cranial Nerves: I: smell Not tested  II: visual acuity  OS: Normal  OD: Normal   II: visual fields Full to confrontation  II: pupils Equal, round, reactive to light  III,VII: ptosis None  III,IV,VI: extraocular muscles  Full ROM  V: mastication Normal  V: facial light touch sensation  Normal  V,VII: corneal reflex  Present  VII: facial muscle function - upper  Normal  VII: facial muscle function - lower Normal  VIII: hearing Not tested  IX: soft palate elevation  Normal  IX,X: gag reflex Present  XI: trapezius strength  5/5  XI: sternocleidomastoid strength 5/5  XI: neck flexion  strength  5/5  XII: tongue strength  Normal    Data Review Lab Results  Component Value Date   WBC 9.3 05/24/2019   HGB 15.8 05/24/2019   HCT 48.8 05/24/2019   MCV 88.2 05/24/2019   PLT 188 05/24/2019   Lab Results  Component Value Date   NA 141 05/24/2019   K 4.2 05/24/2019   CL 103 05/24/2019   CO2 29 05/24/2019   BUN 15 05/24/2019   CREATININE 1.10 05/24/2019   GLUCOSE 101 (H) 05/24/2019   No results found for: INR, PROTIME  Assessment/Plan: L3-4 and L4-5 spinal stenosis, lumbago, lumbar radiculopathy, neurogenic claudication: I have discussed the situation with the patient.  I reviewed his imaging studies with him and pointed out the abnormalities.  We have discussed the various treatment options including surgery.  I have described the surgical treatment option of bilateral L3-4 and L4-5 laminotomy/foraminotomies.  I have shown him surgical models.  I have given him a surgical pamphlet.  We have discussed the risk, benefits, alternatives, expected postoperative course, and likelihood of achieving our goals with surgery.  I have answered all his questions.  He has decided to proceed with surgery.   Ophelia Charter 05/26/2019 8:22 AM

## 2019-05-26 NOTE — Evaluation (Signed)
Physical Therapy Evaluation & Discharge Patient Details Name: Mario Rogers MRN: [id number] DOB: 12/13/1965 Today's Date: 05/26/2019   History of Present Illness  Pt is a 53 y/o male s/p Bilateral L3-4 and L4-5 laminotomy/foraminotomy using micro-dissection. PMH including but not limited to DM and HTN.    Clinical Impression  Pt presented supine in bed with HOB elevated, awake and willing to participate in therapy session. Prior to admission, pt reported that he was independent with all functional mobility and ADLs. Pt lives with his spouse in a single level home with several steps to enter. At the time of evaluation, pt at supervision level with all functional mobility including stair training. Pt with no LOB or need for physical assistance throughout. PT provided pt education re: back precautions, car transfers and a generalized walking program for pt to initiate upon d/c home. Pt expressed and demonstrated understanding throughout. No further acute PT needs identified at this time. PT signing off.     Follow Up Recommendations No PT follow up    Equipment Recommendations  None recommended by PT    Recommendations for Other Services       Precautions / Restrictions Precautions Precautions: Back Precaution Booklet Issued: Yes (comment) Precaution Comments: reviewed 3/3 back precautions with pt throughout Required Braces or Orthoses: (no brace needed MD orders) Restrictions Weight Bearing Restrictions: No      Mobility  Bed Mobility Overal bed mobility: Needs Assistance Bed Mobility: Rolling;Sidelying to Sit;Sit to Sidelying Rolling: Supervision Sidelying to sit: Supervision     Sit to sidelying: Supervision General bed mobility comments: supervision for safety and cueing for log roll technique  Transfers Overall transfer level: Needs assistance Equipment used: None Transfers: Sit to/from Stand Sit to Stand: Supervision         General transfer comment: for  safety  Ambulation/Gait Ambulation/Gait assistance: Supervision Gait Distance (Feet): 500 Feet Assistive device: None Gait Pattern/deviations: Step-through pattern;Decreased stride length Gait velocity: able to fluctuate   General Gait Details: pt with mild instability but no overt LOB or need for physical assistance, supervision for safety  Stairs Stairs: Yes Stairs assistance: Min guard Stair Management: One rail Right;Alternating pattern;Step to pattern;Forwards Number of Stairs: 10 General stair comments: no LOB or need for physical assistance  Wheelchair Mobility    Modified Rankin (Stroke Patients Only)       Balance Overall balance assessment: Needs assistance Sitting-balance support: Feet supported Sitting balance-Leahy Scale: Good     Standing balance support: During functional activity;No upper extremity supported Standing balance-Leahy Scale: Fair                               Pertinent Vitals/Pain Pain Assessment: Faces Faces Pain Scale: Hurts little more Pain Location: back Pain Descriptors / Indicators: Burning;Sore Pain Intervention(s): Monitored during session;Repositioned    Home Living Family/patient expects to be discharged to:: Private residence Living Arrangements: Spouse/significant other Available Help at Discharge: Family Type of Home: House Home Access: Stairs to enter Entrance Stairs-Rails: Doctor, general practice of Steps: 4 Home Layout: One level Home Equipment: None      Prior Function Level of Independence: Independent         Comments: works as a Data processing manager at a [employer]        Extremity/Trunk Assessment   Upper Extremity Assessment Upper Extremity Assessment: Overall WFL for tasks assessed    Lower Extremity Assessment Lower  Extremity Assessment: Overall WFL for tasks assessed    Cervical / Trunk Assessment Cervical / Trunk Assessment: Other  exceptions Cervical / Trunk Exceptions: s/p lumbar sx  Communication   Communication: No difficulties  Cognition Arousal/Alertness: Awake/alert Behavior During Therapy: WFL for tasks assessed/performed Overall Cognitive Status: Within Functional Limits for tasks assessed                                        General Comments      Exercises     Assessment/Plan    PT Assessment Patent does not need any further PT services  PT Problem List         PT Treatment Interventions      PT Goals (Current goals can be found in the Care Plan section)  Acute Rehab PT Goals Patient Stated Goal: "home today" PT Goal Formulation: All assessment and education complete, DC therapy    Frequency     Barriers to discharge        Co-evaluation               AM-PAC PT "6 Clicks" Mobility  Outcome Measure Help needed turning from your back to your side while in a flat bed without using bedrails?: None Help needed moving from lying on your back to sitting on the side of a flat bed without using bedrails?: None Help needed moving to and from a bed to a chair (including a wheelchair)?: None Help needed standing up from a chair using your arms (e.g., wheelchair or bedside chair)?: None Help needed to walk in hospital room?: None Help needed climbing 3-5 steps with a railing? : None 6 Click Score: 24    End of Session Equipment Utilized During Treatment: Gait belt Activity Tolerance: Patient tolerated treatment well Patient left: in bed;with call bell/phone within reach;with family/visitor present Nurse Communication: Mobility status PT Visit Diagnosis: Other abnormalities of gait and mobility (R26.89)    Time: 4008-6761 PT Time Calculation (min) (ACUTE ONLY): 16 min   Charges:   PT Evaluation $PT Eval Low Complexity: 1 Low          Eduard Clos, PT, DPT  Acute Rehabilitation Services Pager [phone] Office [place] 05/26/2019, 4:29 PM

## 2019-05-26 NOTE — Anesthesia Postprocedure Evaluation (Signed)
Anesthesia Post Note  Patient: Mario Rogers  Procedure(s) Performed: LAMINECTOMY AND FORAMINOTOMY BILATERAL LUMBAR THREE- LUMBAR FOUR, LUMBAR FOUR- LUMBAR FIVE (Bilateral Spine Lumbar)     Patient location during evaluation: PACU Anesthesia Type: General Level of consciousness: sedated Pain management: pain level controlled Vital Signs Assessment: post-procedure vital signs reviewed and stable Respiratory status: spontaneous breathing and respiratory function stable Cardiovascular status: stable Postop Assessment: no apparent nausea or vomiting Anesthetic complications: no    Last Vitals:  Vitals:   05/26/19 1150 05/26/19 1214  BP: (!) 146/86 134/83  Pulse: 82 85  Resp: 18 18  Temp: 36.5 C (!) 36.4 C  SpO2: 95% 96%    Last Pain:  Vitals:   05/26/19 1245  TempSrc:   PainSc: 7                  Jamiere Gulas DANIEL

## 2019-05-26 NOTE — Anesthesia Procedure Notes (Signed)
Procedure Name: Intubation Date/Time: 05/26/2019 8:39 AM Performed by: Larene Beach, CRNA Pre-anesthesia Checklist: Patient identified, Emergency Drugs available, Suction available and Patient being monitored Patient Re-evaluated:Patient Re-evaluated prior to induction Oxygen Delivery Method: Circle system utilized Preoxygenation: Pre-oxygenation with 100% oxygen Induction Type: IV induction Ventilation: Mask ventilation without difficulty and Two handed mask ventilation required Laryngoscope Size: Miller and 2 Grade View: Grade II Tube type: Oral Tube size: 7.5 mm Number of attempts: 1 Airway Equipment and Method: Stylet and Oral airway Placement Confirmation: ETT inserted through vocal cords under direct vision,  positive ETCO2 and breath sounds checked- equal and bilateral Secured at: 23 cm Tube secured with: Tape Dental Injury: Teeth and Oropharynx as per pre-operative assessment  Comments: First attempt by Johna Sheriff unsuccessful, grade 3 view with MAC4 blade.  Second attempt by MD Tobias Alexander successful with Sabra Heck 2 blade. Pt mask ventilated between attempts.

## 2019-05-26 NOTE — Transfer of Care (Signed)
Immediate Anesthesia Transfer of Care Note  Patient: Ernst Bowler  Procedure(s) Performed: LAMINECTOMY AND FORAMINOTOMY BILATERAL LUMBAR THREE- LUMBAR FOUR, LUMBAR FOUR- LUMBAR FIVE (Bilateral Spine Lumbar)  Patient Location: PACU  Anesthesia Type:General  Level of Consciousness: drowsy  Airway & Oxygen Therapy: Patient Spontanous Breathing and Patient connected to face mask oxygen  Post-op Assessment: Report given to RN, Post -op Vital signs reviewed and stable and Patient moving all extremities X 4  Post vital signs: Reviewed and stable  Last Vitals:  Vitals Value Taken Time  BP 143/98 05/26/19 1120  Temp    Pulse 99 05/26/19 1123  Resp 21 05/26/19 1123  SpO2 96 % 05/26/19 1123  Vitals shown include unvalidated device data.  Last Pain:  Vitals:   05/26/19 0659  PainSc: 0-No pain      Patients Stated Pain Goal: 4 (91/50/56 9794)  Complications: No apparent anesthesia complications

## 2019-05-26 NOTE — Op Note (Signed)
Brief history: The patient is a 53 year old obese white male who has complained of back and bilateral leg pain.  He has failed medical management.  He was worked up with a lumbar MRI and myelo CT which demonstrated spinal stenosis at L3-4 and L4-5.  I discussed the various treatment options with him.  He has weighed the risks, benefits and alternatives to surgery and decided proceed with bilateral L3-4 and L4-5 laminotomy/foraminotomies.  Preoperative diagnosis: L3-4 and L4-5 spinal stenosis, lumbago, lumbar radiculopathy, neurogenic claudication  Postoperative diagnosis: The same  Procedure: Bilateral L3-4 and L4-5 laminotomy/foraminotomy using micro-dissection  Surgeon: Dr. Earle Gell  Asst.: Dr. Dayton Bailiff and Arnetha Massy nurse practitioner  Anesthesia: Gen. endotracheal  Estimated blood loss: 125 cc  Drains: None  Complications: None  Description of procedure: The patient was brought to the operating room by the anesthesia team. General endotracheal anesthesia was induced. The patient was turned to the prone position on the Wilson frame. The patient's lumbosacral region was then prepared with Betadine scrub and Betadine solution. Sterile drapes were applied.  I then injected the area to be incised with Marcaine with epinephrine solution. I then used a scalpel to make a linear midline incision over the L3-4 and L4-5 intervertebral disc space. I then used electrocautery to perform a bilateral subperiosteal dissection exposing the spinous process and lamina of L3, L4 and L5. We obtained intraoperative radiograph to confirm our location. I then inserted the [facility] retractor for exposure.  We then brought the operative microscope into the field. Under its magnification and illumination we completed the microdissection. I used a high-speed drill to perform a laminotomy at L3-4 and L4-5 bilaterally. I then used a Kerrison punches to widen the laminotomy and removed the ligamentum flavum  at 3 4 and L4-5 bilaterally. We then used microdissection to free up the thecal sac and the L4 and L5 nerve root from the epidural tissue. I then used a Kerrison punch to perform a foraminotomy at about the bilateral L4 and L5 nerve root. We then using the nerve root retractor to gently retract the thecal sac and the nerve roots medially questionably.  We inspected the intervertebral disc bilaterally at L3-4 and L4-5.  There were no significant herniations.  I then palpated along the ventral surface of the thecal sac and along exit route of the bilateral L4 and L5 nerve root and noted that the neural structures were well decompressed. This completed the decompression.  We then obtained hemostasis using bipolar electrocautery. We irrigated the wound out with bacitracin solution. We then removed the retractor.  We injected Exparel.  We then reapproximated the patient's thoracolumbar fascia with interrupted #1 Vicryl suture. We then reapproximated the patient's subcutaneous tissue with interrupted 2-0 Vicryl suture. We then reapproximated patient's skin with Steri-Strips and benzoin. The was then coated with bacitracin ointment. The drapes were removed. The patient was subsequently returned to the supine position where they were extubated by the anesthesia team. The patient was then transported to the postanesthesia care unit in stable condition. All sponge instrument and needle counts were reportedly correct at the end of this case.

## 2019-05-26 NOTE — Plan of Care (Signed)
Pt doing well. Pt and wife given D/C instructions with verbal understanding. Rx's were sent to pharmacy by MD. Pt's incision has a scant amount of drainage but no sign of infection. Pt's IV was removed prior to D/C. Pt D/C'd home via wheelchair per MD order. Pt is stable @ D/C and has no other needs at this time. Larkin Ina

## 2019-05-26 NOTE — Progress Notes (Signed)
Patient states he did not quarantine after his covid test 05/24/2019, stating he only found out that he was supposed to quarantine on Monday when he went to get his covid test but that was not enough time in advance to plan not to go to work. Patient states he went to work yesterday, but that he did wear a mask. Research scientist (physical sciences), Water quality scientist notified, who also notified Sam, Immunologist. Dr. Gifford Shave notified as Dr. Tobias Alexander had not arrived yet.

## 2019-05-27 ENCOUNTER — Encounter (HOSPITAL_COMMUNITY): Payer: Self-pay | Admitting: Neurosurgery

## 2020-02-23 ENCOUNTER — Ambulatory Visit: Payer: 59 | Admitting: Sports Medicine

## 2020-09-09 IMAGING — XA DG MYELOGRAPHY LUMBAR INJ LUMBOSACRAL
6 of 16 series · 6 of 16 positions shown · non-contrast
Comparison: 03/08/2019 lumbar spine MRI from [REDACTED]

CLINICAL DATA: Bilateral lower leg pain.
TECHNIQUE: Contiguous axial images were obtained through the Lumbar spine after
the intrathecal infusion of infusion. Coronal and sagittal
reconstructions were obtained of the axial image sets.

[Series 1: vasc adipose · 1 of 1 slices shown (1 of 3)]
[im 1/1]
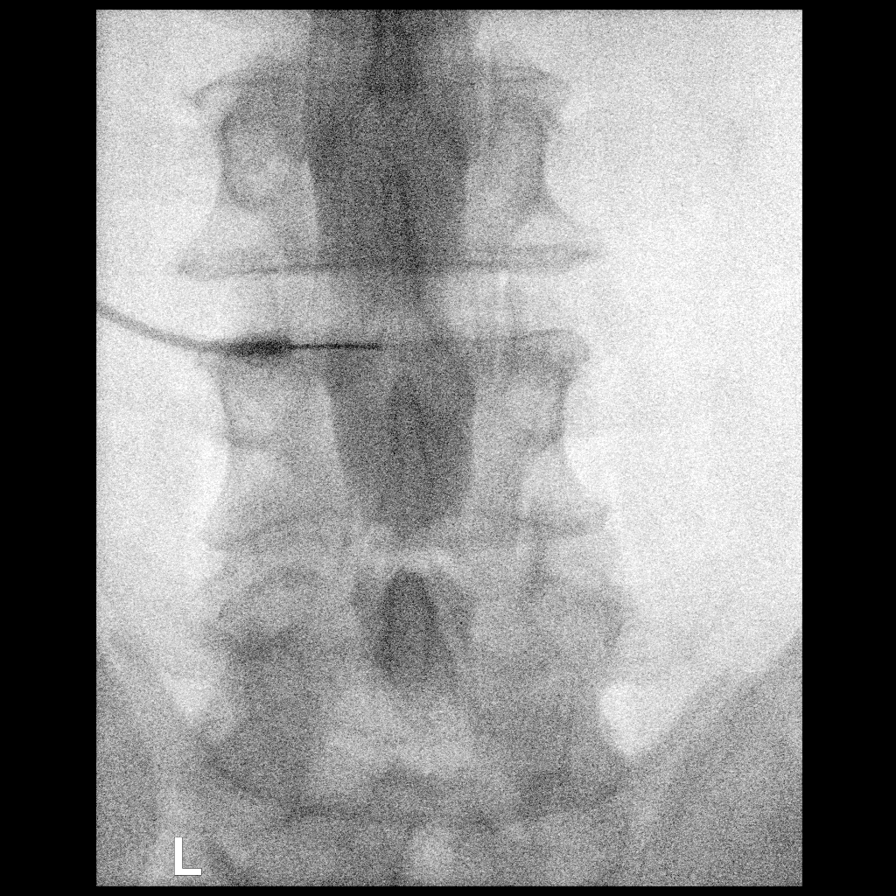

[Series 1: w lumbar spine lat · 0.15mm/px · 1 of 1 slices shown]
[im 1/1]
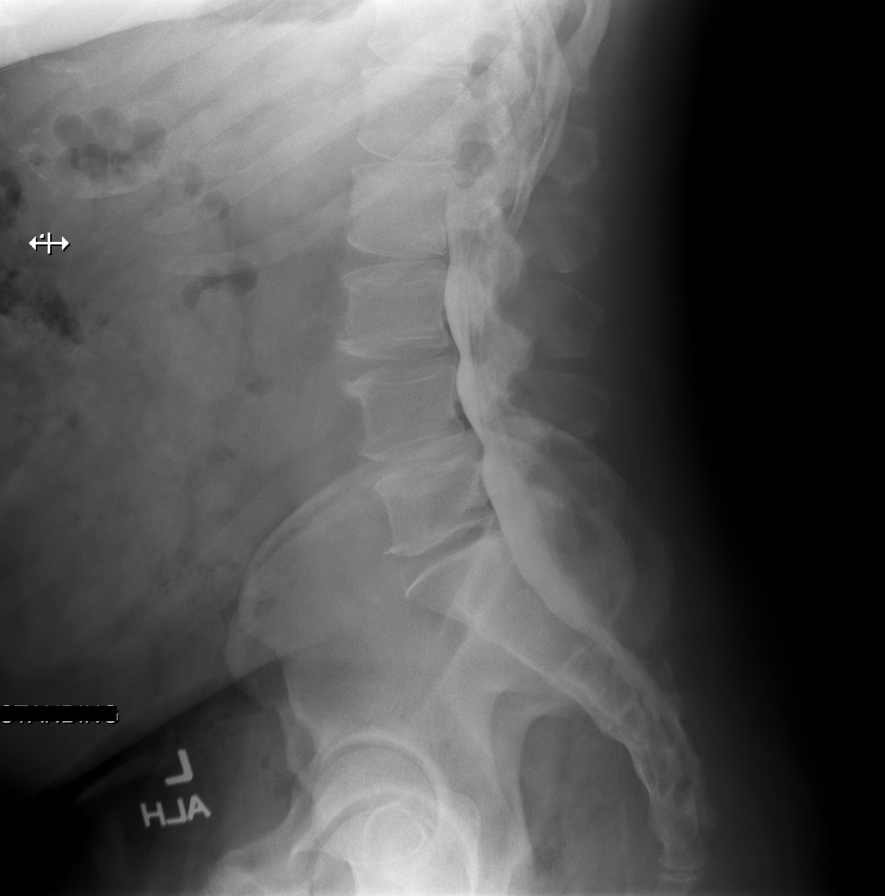

[Series 2: vasc adipose · 1 of 1 slices shown (2 of 3)]
[im 1/1]
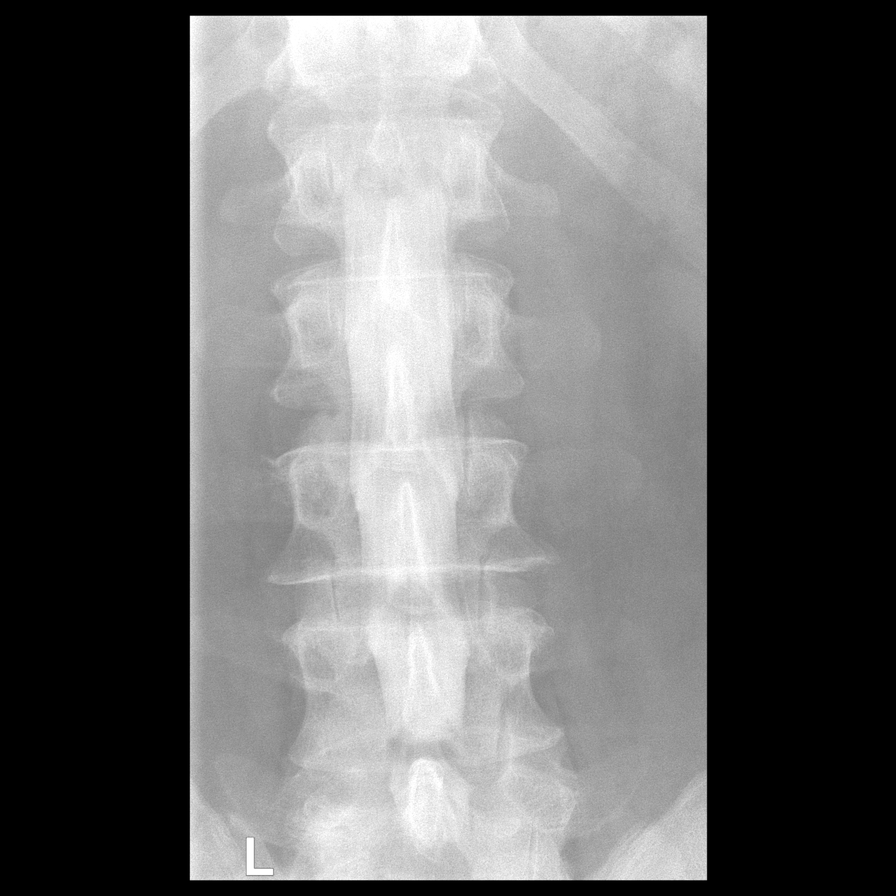

[Series 2: w lumbar spine flexion · 0.15mm/px · 1 of 1 slices shown]
[im 1/1]
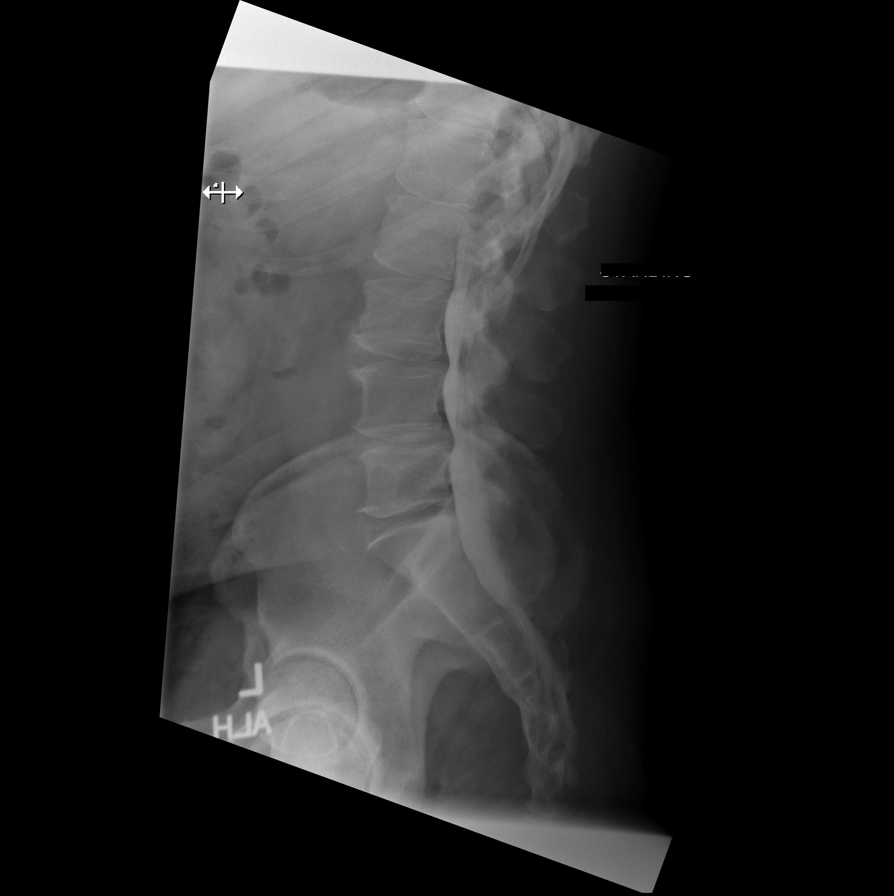

[Series 3: vasc adipose · 1 of 1 slices shown (3 of 3)]
[im 1/1]
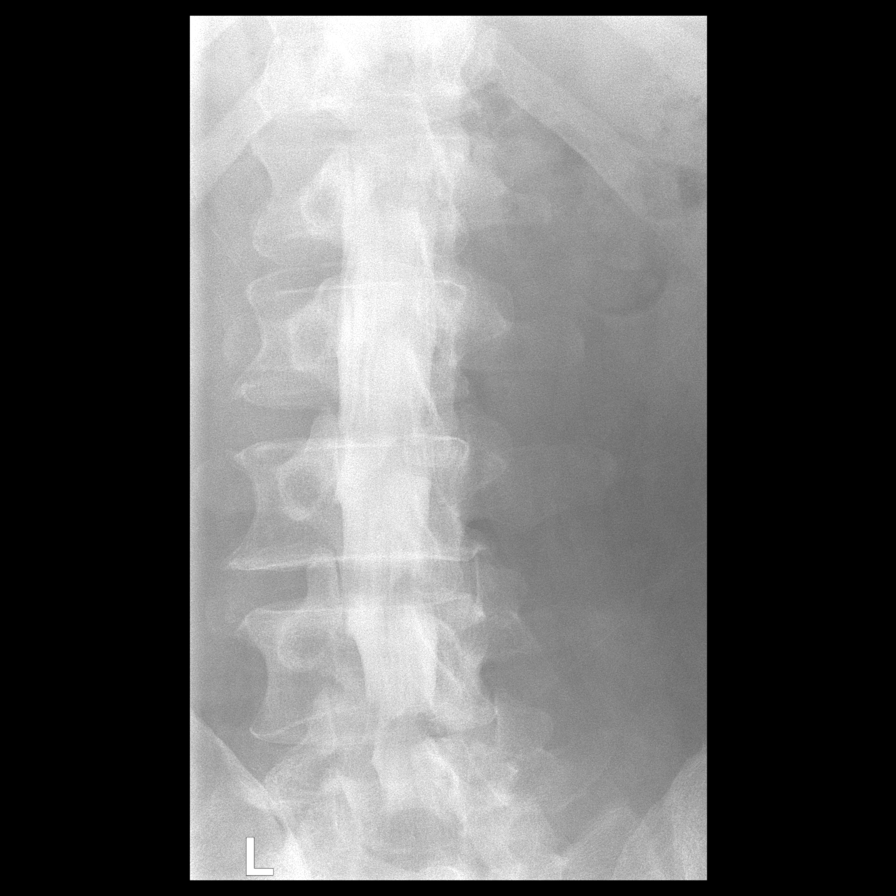

[Series 3: w lumbar spine extension · 0.15mm/px · 1 of 1 slices shown]
[im 1/1]
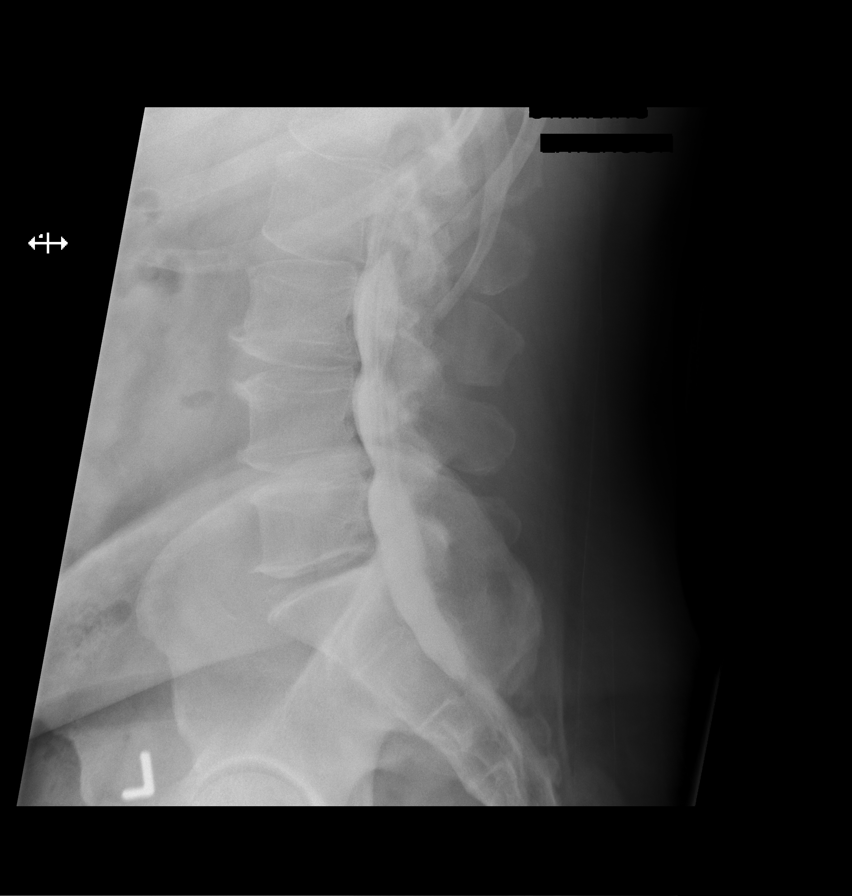

[6 of 16 positions shown; findings below may reference images not displayed]

EXAM:
LUMBAR MYELOGRAM

FLUOROSCOPY TIME:  Fluoroscopy Time: 17 seconds

Radiation Exposure Index: 313.58 microGray*m^2

PROCEDURE:
After thorough discussion of risks and benefits of the procedure
including bleeding, infection, injury to nerves, blood vessels,
adjacent structures as well as headache and CSF leak, written and
oral informed consent was obtained. Consent was obtained by Dr.
Brunyx Ante. Time out form was completed.

Patient was positioned prone on the fluoroscopy table. Local
anesthesia was provided with 1% lidocaine without epinephrine after
prepped and draped in the usual sterile fashion. Puncture was
performed at L3-4 using a 3 1/2 inch 22-gauge spinal needle via a
left interlaminar approach. Using a single pass through the dura,
the needle was placed within the thecal sac, with return of clear
CSF. 15 mL of Isovue [id number] was injected into the thecal sac, with
normal opacification of the nerve roots and cauda equina consistent
with free flow within the subarachnoid space.

I personally performed the lumbar puncture and administered the
intrathecal contrast. I also personally supervised acquisition of
the myelogram images.
FINDINGS: LUMBAR MYELOGRAM FINDINGS:

There are 5 non rib-bearing lumbar type vertebrae. Vertebral
alignment is normal on prone images. With standing, there is
borderline anterolisthesis of L3 on L4 and L4 on L5 without evidence
of abnormal motion during flexion or extension. There is a ventral
extradural defect at L4-5 with evidence at least mild spinal
stenosis as well as asymmetric right lateral recess stenosis. A
smaller ventral extradural defect is present at L3-4 with at most
mild spinal stenosis. Neither of these levels appears worse with
standing.

CT LUMBAR MYELOGRAM FINDINGS:

Vertebral alignment is normal on this supine CT. No fracture or
suspicious osseous lesion is identified. The conus medullaris
terminates at L1-2. The paraspinal soft tissues are unremarkable.

T12-L1: Minimal facet arthrosis and minimal disc bulging without
stenosis.

L1-2: Negative.

L2-3: Mild facet arthrosis without disc herniation or stenosis.

L3-4: Disc bulging eccentric to the left, endplate spurring, and
moderate facet hypertrophy result in borderline spinal and lateral
recess stenosis and moderate left neural foraminal stenosis
potentially affecting the exiting left L3 nerve root. Neural
foraminal stenosis is more prominent than what was demonstrated on
the prior MRI.

L4-5: Mild disc space narrowing. Circumferential disc bulging and
moderate facet hypertrophy result in mild spinal stenosis and
moderate right and mild left lateral recess stenosis. A small left
paracentral/subarticular disc extrusion on the prior MRI has
regressed with improved left lateral recess patency. Right lateral
recess stenosis is unchanged and may result in right L5 nerve root
impingement. Patent neural foramina.

L5-S1: Mild disc space narrowing. Previous left laminotomy with
suspected mild epidural fibrosis in the left lateral recess. Disc
bulging and spurring eccentric to the left and mild facet arthrosis
result in mild left neural foraminal stenosis without spinal
stenosis, unchanged.
IMPRESSION: 1. Interval regression of a left paracentral/subarticular disc
extrusion at L4-5 with improved left lateral recess patency.
Unchanged moderate right lateral recess stenosis with potential
right L5 nerve root impingement.
2. Moderate left neural foraminal stenosis at L3-4.
3. Mild left neural foraminal stenosis at L5-S1.
